# Patient Record
Sex: Female | Born: 1985
Health system: Southern US, Community
[De-identification: ages and names within clinical notes are randomized; demographics above are authoritative.]

## PROBLEM LIST (undated history)

## (undated) DIAGNOSIS — D649 Anemia, unspecified: Secondary | ICD-10-CM

## (undated) DIAGNOSIS — E669 Obesity, unspecified: Secondary | ICD-10-CM

## (undated) DIAGNOSIS — E039 Hypothyroidism, unspecified: Secondary | ICD-10-CM

## (undated) HISTORY — DX: Hypothyroidism, unspecified: E03.9

## (undated) HISTORY — DX: Obesity, unspecified: E66.9

## (undated) HISTORY — DX: Anemia, unspecified: D64.9

---

## 2012-05-14 ENCOUNTER — Ambulatory Visit (INDEPENDENT_AMBULATORY_CARE_PROVIDER_SITE_OTHER): Payer: BC Managed Care – PPO | Admitting: Family Medicine

## 2012-05-14 ENCOUNTER — Ambulatory Visit: Payer: BC Managed Care – PPO

## 2012-05-14 VITALS — BP 114/68 | HR 65 | Temp 98.6°F | Resp 16 | Ht 64.0 in | Wt 174.6 lb

## 2012-05-14 DIAGNOSIS — N898 Other specified noninflammatory disorders of vagina: Secondary | ICD-10-CM

## 2012-05-14 DIAGNOSIS — M79673 Pain in unspecified foot: Secondary | ICD-10-CM

## 2012-05-14 DIAGNOSIS — M79609 Pain in unspecified limb: Secondary | ICD-10-CM

## 2012-05-14 DIAGNOSIS — N760 Acute vaginitis: Secondary | ICD-10-CM

## 2012-05-14 DIAGNOSIS — S99929A Unspecified injury of unspecified foot, initial encounter: Secondary | ICD-10-CM

## 2012-05-14 DIAGNOSIS — S8990XA Unspecified injury of unspecified lower leg, initial encounter: Secondary | ICD-10-CM

## 2012-05-14 DIAGNOSIS — L293 Anogenital pruritus, unspecified: Secondary | ICD-10-CM

## 2012-05-14 DIAGNOSIS — B9689 Other specified bacterial agents as the cause of diseases classified elsewhere: Secondary | ICD-10-CM

## 2012-05-14 LAB — POCT WET PREP WITH KOH
Clue Cells Wet Prep HPF POC: NEGATIVE
WBC Wet Prep HPF POC: NEGATIVE
Yeast Wet Prep HPF POC: NEGATIVE

## 2012-05-14 MED ORDER — MELOXICAM 15 MG PO TABS: 15.0000 mg | ORAL_TABLET | Freq: Every day | ORAL | Status: AC

## 2012-05-14 MED ORDER — METRONIDAZOLE 500 MG PO TABS: 500.0000 mg | ORAL_TABLET | Freq: Two times a day (BID) | ORAL | Status: DC

## 2012-05-14 NOTE — Patient Instructions (Addendum)
Wear the post-op shoe for the next 1-2 weeks.  Take Mobic (meloxicam) once daily for the next two weeks to reduce pain and inflammation.  Do not take any additional ibuprofen or aleve with this medication.  You may take Tylenol if needed for extra pain relief.  If you are not seeing improvement in the next 1-2 weeks, let us know and we can refer you to a foot specialist.     Begin taking the Flagyl (metronidazole).  Be sure to finish the full course.  DO NOT drink alcohol while taking this medication or for 48 hours after your last dose.  If you are worsening or not improving, please let us know.   Bacterial Vaginosis Bacterial vaginosis (BV) is a vaginal infection where the normal balance of bacteria in the vagina is disrupted. The normal balance is then replaced by an overgrowth of certain bacteria. There are several different kinds of bacteria that can cause BV. BV is the most common vaginal infection in women of childbearing age. CAUSES   The cause of BV is not fully understood. BV develops when there is an increase or imbalance of harmful bacteria.  Some activities or behaviors can upset the normal balance of bacteria in the vagina and put women at increased risk including:  Having a new sex partner or multiple sex partners.  Douching.  Using an intrauterine device (IUD) for contraception.  It is not clear what role sexual activity plays in the development of BV. However, women that have never had sexual intercourse are rarely infected with BV. Women do not get BV from toilet seats, bedding, swimming pools or from touching objects around them.  SYMPTOMS   Grey vaginal discharge.  A fish-like odor with discharge, especially after sexual intercourse.  Itching or burning of the vagina and vulva.  Burning or pain with urination.  Some women have no signs or symptoms at all. DIAGNOSIS  Your caregiver must examine the vagina for signs of BV. Your caregiver will perform lab tests and  look at the sample of vaginal fluid through a microscope. They will look for bacteria and abnormal cells (clue cells), a pH test higher than 4.5, and a positive amine test all associated with BV.  RISKS AND COMPLICATIONS   Pelvic inflammatory disease (PID).  Infections following gynecology surgery.  Developing HIV.  Developing herpes virus. TREATMENT  Sometimes BV will clear up without treatment. However, all women with symptoms of BV should be treated to avoid complications, especially if gynecology surgery is planned. Female partners generally do not need to be treated. However, BV may spread between female sex partners so treatment is helpful in preventing a recurrence of BV.   BV may be treated with antibiotics. The antibiotics come in either pill or vaginal cream forms. Either can be used with nonpregnant or pregnant women, but the recommended dosages differ. These antibiotics are not harmful to the baby.  BV can recur after treatment. If this happens, a second round of antibiotics will often be prescribed.  Treatment is important for pregnant women. If not treated, BV can cause a premature delivery, especially for a pregnant woman who had a premature birth in the past. All pregnant women who have symptoms of BV should be checked and treated.  For chronic reoccurrence of BV, treatment with a type of prescribed gel vaginally twice a week is helpful. HOME CARE INSTRUCTIONS   Finish all medication as directed by your caregiver.  Do not have sex until treatment is completed.  Tell your sexual partner that you have a vaginal infection. They should see their caregiver and be treated if they have problems, such as a mild rash or itching.  Practice safe sex. Use condoms. Only have 1 sex partner. PREVENTION  Basic prevention steps can help reduce the risk of upsetting the natural balance of bacteria in the vagina and developing BV:  Do not have sexual intercourse (be abstinent).  Do not  douche.  Use all of the medicine prescribed for treatment of BV, even if the signs and symptoms go away.  Tell your sex partner if you have BV. That way, they can be treated, if needed, to prevent reoccurrence. SEEK MEDICAL CARE IF:   Your symptoms are not improving after 3 days of treatment.  You have increased discharge, pain, or fever. MAKE SURE YOU:   Understand these instructions.  Will watch your condition.  Will get help right away if you are not doing well or get worse. FOR MORE INFORMATION  Division of STD Prevention (DSTDP), Centers for Disease Control and Prevention: [employer] American Social Health Association (ASHA): www.ashastd.org  Document Released: 04/18/2005 Document Revised: 07/11/2011 Document Reviewed: 10/09/2008 [facility] Patient Information 2013 [place], [place].

## 2012-05-14 NOTE — Progress Notes (Signed)
Subjective:    Patient ID: Nicole Bush, female    DOB: 26-Apr-1986, 27 y.o.   MRN: [id number]  HPI   Nicole Bush is a 27 yr old female here with two concerns today.    (1) Right great toe pain - States she tripped and fell about 3-4 weeks ago; not sure what happened, but pain and swelling of the right great toe, no bruising.   Used ice, swelling went away.  Ibuprofen, tylenol for pain relief, but not helping.  Able to bear weight, but can only wear tennis shoes or boots. Has pain with walking but has been favoring the foot to avoid discomfort.  No numbness or change in sensation in the toe; other toes ok.  Does endorse pain into the forefoot with walking.    (2) Vaginal itching - Started maybe 2 days ago.  States she has had this before - has had BV and yeast in the past, but it has been awhile since her last episode.   Denies abnormal discharge.  No abnormal bleeding. No new exposures to detergents or hygiene products.  She is currently sexually active with 1 partner. States she has no concern for STI.  Normal pap Dec 2012, sees GYN for this. LMP about 2 weeks ago, normal.  Using OCPs.  Denies urinary symptoms, abd pain, nausea, vomiting, back pain.  Review of Systems  Constitutional: Negative for fever and chills.  HENT: Negative.   Respiratory: Negative.   Cardiovascular: Negative.   Gastrointestinal: Negative for nausea, vomiting and abdominal pain.  Genitourinary: Negative for dysuria, urgency, frequency, hematuria, flank pain, vaginal bleeding, vaginal discharge, genital sores, vaginal pain, menstrual problem and pelvic pain.       Vaginal itching   Musculoskeletal: Positive for arthralgias (R great toe).  Skin: Negative.   Neurological: Negative for weakness and numbness.       Objective:   Physical Exam  Vitals reviewed. Constitutional: She appears well-developed and well-nourished. No distress.  HENT:  Head: Normocephalic and atraumatic.  Eyes: Conjunctivae normal  are normal. No scleral icterus.  Cardiovascular: Normal rate, regular rhythm and normal heart sounds.  Exam reveals no gallop and no friction rub.   No murmur heard. Pulses:      Dorsalis pedis pulses are 2+ on the right side, and 2+ on the left side.       Posterior tibial pulses are 2+ on the right side, and 2+ on the left side.  Pulmonary/Chest: Effort normal and breath sounds normal. She has no wheezes. She has no rales.  Abdominal: Soft. Bowel sounds are normal. She exhibits no distension and no mass. There is no tenderness. There is no rigidity, no rebound, no guarding and no CVA tenderness.  Genitourinary: Uterus normal. There is no rash, tenderness or lesion on the right labia. There is no rash, tenderness or lesion on the left labia. Cervix exhibits no motion tenderness and no discharge. Right adnexum displays no mass, no tenderness and no fullness. Left adnexum displays no mass, no tenderness and no fullness. Vaginal discharge (white, homogenous) found.       Initial self wet prep unrevealing; on exam, white discharge at vaginal introitus and along vaginal walls; cervix normal, no cervical discharge; bimanual exam normal; additional wet prep sample taken  Musculoskeletal:       Right ankle: Normal.       Left ankle: Normal.       Right foot: She exhibits tenderness (first metatarsal). She exhibits normal range of motion,  no swelling, normal capillary refill, no crepitus and no deformity.       Left foot: Normal.       Feet:       TTP over first metatarsal; no swelling, ecchymosis, deformity; full AROM but painful      Filed Vitals:   05/14/12 1345  BP: 114/68  Pulse: 65  Temp: 98.6 F (37 C)  Resp: 16     [facility] reading (PRIMARY) by  Dr. Patsy Lager - normal foot xray, no fracture.    Results for orders placed in visit on 05/14/12  POCT WET PREP WITH KOH      Component Value Range   Trichomonas, UA Negative     Clue Cells Wet Prep HPF POC neg     Epithelial Wet Prep HPF  POC 4-8     Yeast Wet Prep HPF POC neg     Bacteria Wet Prep HPF POC 4+     RBC Wet Prep HPF POC neg     WBC Wet Prep HPF POC 0-1     KOH Prep POC Negative    POCT WET PREP WITH KOH      Component Value Range   Trichomonas, UA Negative     Clue Cells Wet Prep HPF POC 2-4     Epithelial Wet Prep HPF POC 10-18     Yeast Wet Prep HPF POC neg     Bacteria Wet Prep HPF POC 4+     RBC Wet Prep HPF POC 0-1     WBC Wet Prep HPF POC neg     KOH Prep POC Positive         Assessment & Plan:   1. Foot pain  DG Foot Complete Right, meloxicam (MOBIC) 15 MG tablet  2. Foot injury  DG Foot Complete Right  3. Bacterial vaginitis  metroNIDAZOLE (FLAGYL) 500 MG tablet  4. Vaginal itching  POCT Wet Prep with KOH, POCT Wet Prep with KOH    Nicole Bush is a very pleasant 27 yr old female here with right foot/toe pain and bacterial vaginosis  (1-2)  Foot pain/injury - s/p injury to the right great toe/foot 3-4 weeks ago with continued discomfort with walking.  Complete x-rays of the foot were negative for fracture.  Suspect sprain injury.  Discussed the use of a post-op shoe for the next 1-2 weeks to provide additional immobilization.  Will try Mobic for pain relief.  Pt states that she has recently enrolled in a boot camp twice/wk and would like to participate if possible.  Discussed that this may delay healing but that if she is able to participate without pain this should be ok.  Encouraged relative rest when possible.  If not seeing any improvement in the next 1-2 weeks, may considering referring to foot specialist.  (3-4) Bacterial vaginosis - Initial self wet prep collected by pt was unrevealing.  On exam, white discharge coating vaginal walls.  Repeat wet prep with clue cells and +KOH.  Will treat as BV with Flagyl x 7 days.  Discussed avoidance of etoh while on flagyl.  Discussed RTC precautions, pt understands and is in agreement with this plan.

## 2012-05-24 ENCOUNTER — Telehealth: Payer: Self-pay

## 2012-05-24 NOTE — Telephone Encounter (Signed)
Patient is not any better from her OV with [facility].  [phone]  Cell phone  [phone]  Work phone

## 2012-05-24 NOTE — Telephone Encounter (Signed)
Spoke with pt, I advised her to recheck because her SX's are not improving. Her toe is still in a lot of pain. She will RTC but cannot come into clinic until Wednesday due to her schedule. She also wanted to see if [facility] can write her a note stating her toe injury is preventing her from participating in her boot camp lessons that she paid for. They will not give her a refund without a medical note. Please advise.

## 2012-05-24 NOTE — Telephone Encounter (Signed)
Called pt to get details, LMOM to CB.

## 2012-05-25 NOTE — Telephone Encounter (Signed)
Yes pt should RTC.  I would advise her to continue the post-op wearing and resting when possible.  We can give her a note for bootcamp when she comes back in

## 2012-05-25 NOTE — Telephone Encounter (Signed)
Called pt, gave msg  from [facility], pt understood.

## 2013-01-10 ENCOUNTER — Encounter: Payer: Self-pay | Admitting: Family Medicine

## 2013-01-10 ENCOUNTER — Ambulatory Visit (INDEPENDENT_AMBULATORY_CARE_PROVIDER_SITE_OTHER): Payer: BC Managed Care – PPO | Admitting: Family Medicine

## 2013-01-10 VITALS — BP 102/68 | HR 89 | Temp 99.0°F | Resp 16 | Ht 64.0 in | Wt 180.0 lb

## 2013-01-10 DIAGNOSIS — H6692 Otitis media, unspecified, left ear: Secondary | ICD-10-CM

## 2013-01-10 DIAGNOSIS — H6690 Otitis media, unspecified, unspecified ear: Secondary | ICD-10-CM

## 2013-01-10 MED ORDER — CEFDINIR 300 MG PO CAPS: 300.0000 mg | ORAL_CAPSULE | Freq: Two times a day (BID) | ORAL | Status: DC

## 2013-01-10 NOTE — Progress Notes (Signed)
Patient ID: Nicole Bush MRN: [id number], DOB: Nov 17, 1985, 27 y.o. Date of Encounter: 01/10/2013, 5:09 PM  Primary Physician: No primary provider on file.  Chief Complaint:  Chief Complaint  Patient presents with  . Headache    x 1 day  . Otalgia    x 1 day  . Dizziness    x 1 days    HPI: 27 y.o. year old female presents with 1 day history of otalgia. Symptoms began with nasal congestion, sore throat, and cough. Afebrile. Nasal congestion thick and green/yellow. Cough is  Sounds productive. Ear painful and  feels full, leading to sensation of muffled hearing. No drainage or discharge from affected ear. Has tried OTC cold preps without success. No GI complaints.   No sick contacts, recent antibiotics, or recent travels.   Here with   History reviewed. No pertinent past medical history.   Home Meds: Prior to Admission medications   Medication Sig Start Date End Date Taking? Authorizing Provider  norgestimate-ethinyl estradiol (ORTHO-CYCLEN,SPRINTEC,PREVIFEM) 0.25-35 MG-MCG tablet Take 1 tablet by mouth daily.   Yes Historical Provider, MD  traZODone (DESYREL) 50 MG tablet Take 50 mg by mouth at bedtime.   Yes Historical Provider, MD  cefdinir (OMNICEF) 300 MG capsule Take 1 capsule (300 mg total) by mouth 2 (two) times daily. 01/10/13   Elvina Sidle, MD  levonorgestrel-ethinyl estradiol (AVIANE,ALESSE,LESSINA) 0.1-20 MG-MCG tablet Take 1 tablet by mouth daily.    Historical Provider, MD  meloxicam (MOBIC) 15 MG tablet Take 1 tablet (15 mg total) by mouth daily. 05/14/12   Eleanore Delia Chimes, PA-C  metroNIDAZOLE (FLAGYL) 500 MG tablet Take 1 tablet (500 mg total) by mouth 2 (two) times daily with a meal. DO NOT CONSUME ALCOHOL WHILE TAKING THIS MEDICATION. 05/14/12   Eleanore Delia Chimes, PA-C    Allergies: No Known Allergies  History   Social History  . Marital Status: Single    Spouse Name: N/A    Number of Children: N/A  . Years of Education: N/A   Occupational  History  . Not on file.   Social History Main Topics  . Smoking status: Never Smoker   . Smokeless tobacco: Not on file  . Alcohol Use: No  . Drug Use: No  . Sexual Activity: Yes   Other Topics Concern  . Not on file   Social History Narrative  . No narrative on file     Review of Systems: Constitutional: negative for chills, fever, night sweats or weight changes HEENT: see above Respiratory: negative for hemoptysis, wheezing, or shortness of breath Abdominal: negative for abdominal pain, nausea, vomiting or diarrhea Dermatological: negative for rash Neurologic: negative for headache   Physical Exam: Blood pressure 102/68, pulse 89, temperature 99 F (37.2 C), temperature source Oral, resp. rate 16, height 5\' 4"  (1.626 m), weight 180 lb (81.647 kg), last menstrual period 12/27/2012, SpO2 99.00%., Body mass index is 30.88 kg/(m^2). General: Well developed, well nourished, in no acute distress. Head: Normocephalic, atraumatic, eyes without discharge, sclera non-icteric, nares are congested. Bilateral auditory canals clear. left TM dull, and bulging with purulent effusion behind. No perforation visualized. Contralateral TM pearly grey with reflective cone of light, no effusion or perforation visualized. Oral cavity moist, dentition normal. Posterior pharynx with post nasal drip and mild erythema. No peritonsillar abscess or tonsillar exudate. Neck: Supple. No thyromegaly. Full ROM. No lymphadenopathy. Lungs: Clear bilaterally to auscultation without wheezes, rales, or rhonchi. Breathing is unlabored.  Heart: RRR with S1 S2. No murmurs,  rubs, or gallops appreciated. Abdomen: Soft, non-tender, non-distended with normoactive bowel sounds. No hepatosplenomegaly. No rebound/guarding. No obvious abdominal masses. McBurney's, Rovsing's, Iliopsoas, and table jar all negative. Msk:  Strength and tone normal for age. Extremities: No clubbing or cyanosis. No edema. Neuro: Alert and oriented X  3. Moves all extremities spontaneously. CNII-XII grossly in tact. Psych:  Responds to questions appropriately with a normal affect.     ASSESSMENT AND PLAN:  27 y.o. year old female with acute otitis media of left ear without perforation. Otitis media, left - Plan: cefdinir (OMNICEF) 300 MG capsule   - -Mucinex for children -Tylenol prn -Rest/fluids -RTC precautions -RTC 3 days if no improvement  Alonna Buckler, md 01/10/2013 5:09 PM

## 2013-01-10 NOTE — Patient Instructions (Addendum)
Otitis Media with Effusion Otitis media with effusion is the presence of fluid in the middle ear. This is a common problem that often follows ear infections. It may be present for weeks or longer after the infection. Unlike an acute ear infection, otits media with effusion refers only to fluid behind the ear drum and not infection. Children with repeated ear and sinus infections and allergy problems are the most likely to get otitis media with effusion. CAUSES  The most frequent cause of the fluid buildup is dysfunction of the eustacian tubes. These are the tubes that drain fluid in the ears to the throat. SYMPTOMS   The main symptom of this condition is hearing loss. As a result, you or your child may:  Listen to the TV at a loud volume.  Not respond to questions.  Ask "what" often when spoken to.  There may be a sensation of fullness or pressure but usually not pain. DIAGNOSIS   Your caregiver will diagnose this condition by examining you or your child's ears.  Your caregiver may test the pressure in you or your child's ear with a tympanometer.  A hearing test may be conducted if the problem persists.  A caregiver will want to re-evaluate the condition periodically to see if it improves. TREATMENT   Treatment depends on the duration and the effects of the effusion.  Antibiotics, decongestants, nose drops, and cortisone-type drugs may not be helpful.  Children with persistent ear effusions may have delayed language. Children at risk for developmental delays in hearing, learning, and speech may require referral to a specialist earlier than children not at risk.  You or your child's caregiver may suggest a referral to an Ear, Nose, and Throat (ENT) surgeon for treatment. The following may help restore normal hearing:  Drainage of fluid.  Placement of ear tubes (tympanostomy tubes).  Removal of adenoids (adenoidectomy). HOME CARE INSTRUCTIONS   Avoid second hand  smoke.  Infants who are breast fed are less likely to have this condition.  Avoid feeding infants while laying flat.  Avoid known environmental allergens.  Be sure to see a caregiver or an ENT specialist for follow up.  Avoid people who are sick. SEEK MEDICAL CARE IF:   Hearing is not better in 3 months.  Hearing is worse.  Ear pain.  Drainage from the ear.  Dizziness. Document Released: 05/26/2004 Document Revised: 07/11/2011 Document Reviewed: 09/08/2009 [employer] Patient Information 2014 [employer].  

## 2013-01-13 ENCOUNTER — Telehealth: Payer: Self-pay

## 2013-01-13 NOTE — Telephone Encounter (Signed)
Patient says the antibiotic is not working and she doesn't feel any better. Is wondering if there is something else she could take instead.

## 2013-01-14 NOTE — Telephone Encounter (Signed)
-  Mucinex for children  -Tylenol prn  -Rest/fluids  -RTC precautions  -RTC 3 days if no improvement

## 2013-01-14 NOTE — Telephone Encounter (Signed)
Called , is she using mucinex? She can add flonase OTC to this. If not helpful she should return to clinic. Called her, left message for her to call me back.

## 2013-01-14 NOTE — Telephone Encounter (Signed)
Patient advised to use mucinex, she has not been using and to add flonase, if these are not helpful, or if she gets worse, she should come back in.

## 2013-05-29 ENCOUNTER — Ambulatory Visit (INDEPENDENT_AMBULATORY_CARE_PROVIDER_SITE_OTHER): Payer: BC Managed Care – PPO | Admitting: Physician Assistant

## 2013-05-29 VITALS — BP 110/70 | HR 77 | Temp 98.5°F | Resp 16 | Ht 65.0 in | Wt 190.0 lb

## 2013-05-29 DIAGNOSIS — Z113 Encounter for screening for infections with a predominantly sexual mode of transmission: Secondary | ICD-10-CM

## 2013-05-29 DIAGNOSIS — B3731 Acute candidiasis of vulva and vagina: Secondary | ICD-10-CM

## 2013-05-29 DIAGNOSIS — B373 Candidiasis of vulva and vagina: Secondary | ICD-10-CM

## 2013-05-29 DIAGNOSIS — L293 Anogenital pruritus, unspecified: Secondary | ICD-10-CM

## 2013-05-29 DIAGNOSIS — N898 Other specified noninflammatory disorders of vagina: Secondary | ICD-10-CM

## 2013-05-29 LAB — POCT URINALYSIS DIPSTICK
Bilirubin, UA: NEGATIVE
Blood, UA: NEGATIVE
Glucose, UA: NEGATIVE
Ketones, UA: NEGATIVE
Leukocytes, UA: NEGATIVE
Nitrite, UA: NEGATIVE
Protein, UA: NEGATIVE
Spec Grav, UA: >=1.03
Urobilinogen, UA: 0.2
pH, UA: 6

## 2013-05-29 LAB — POCT WET PREP WITH KOH
KOH Prep POC: POSITIVE
Trichomonas, UA: NEGATIVE
Yeast Wet Prep HPF POC: POSITIVE

## 2013-05-29 LAB — POCT UA - MICROSCOPIC ONLY
Casts, Ur, LPF, POC: NEGATIVE
Crystals, Ur, HPF, POC: NEGATIVE
Yeast, UA: NEGATIVE

## 2013-05-29 MED ORDER — FLUCONAZOLE 150 MG PO TABS: 150.0000 mg | ORAL_TABLET | Freq: Once | ORAL | Status: AC

## 2013-05-29 NOTE — Patient Instructions (Signed)
Gyne-Lotrimin OTC

## 2013-05-29 NOTE — Progress Notes (Signed)
Subjective:    Patient ID: Nicole Bush, female    DOB: [birth year], 28 y.o.   MRN: [id number]  HPI  28 year old female presents for evaluation of vaginal pruritis x 3 days.  Admits that she has pruritis on the skin surrounding her vagina and inside the vaginal vault as well. Has not noticed any discharge, odor, or pain.  Hx of yeast and BV in the past. Denies nausea, vomiting, abdominal pain, dyspareunia, dysuria, or urinary frequency.  Takes OCP's and uses condoms regularly. Is sexually active with 1 female partner. No specific concern about STI's but would like to be checked for GC/CL.  Has her regular gyn appt in 1 month. Last pap 1 year ago - normal. Had HIV/RPR testing then which was negative. Does not want HIV/RPR today.   Patient is otherwise healthy with no other concerns today.     Review of Systems  Gastrointestinal: Negative for nausea and vomiting.  Genitourinary: Negative for dysuria, frequency, flank pain, vaginal discharge, genital sores, vaginal pain and pelvic pain.       Objective:   Physical Exam  Constitutional: She is oriented to person, place, and time. She appears well-developed and well-nourished.  HENT:  Head: Normocephalic and atraumatic.  Right Ear: External ear normal.  Left Ear: External ear normal.  Eyes: Conjunctivae are normal.  Neck: Normal range of motion.  Cardiovascular: Normal rate.   Pulmonary/Chest: Effort normal.  Genitourinary: Vagina normal. Pelvic exam was performed with patient supine. There is no rash, tenderness or lesion on the right labia. There is no rash, tenderness or lesion on the left labia. Cervix exhibits discharge (thick, clumpy, white vaginal discharge). Cervix exhibits no motion tenderness and no friability. Right adnexum displays no mass, no tenderness and no fullness. Left adnexum displays no mass, no tenderness and no fullness.  Neurological: She is alert and oriented to person, place, and time.  Psychiatric: She has a  normal mood and affect. Her behavior is normal. Judgment and thought content normal.      Results for orders placed in visit on 05/29/13  POCT UA - MICROSCOPIC ONLY      Result Value Range   WBC, Ur, HPF, POC 0-2     RBC, urine, microscopic 0-2     Bacteria, U Microscopic trace     Mucus, UA trace     Epithelial cells, urine per micros 1-3     Crystals, Ur, HPF, POC neg     Casts, Ur, LPF, POC neg     Yeast, UA neg    POCT URINALYSIS DIPSTICK      Result Value Range   Color, UA yellow     Clarity, UA clear     Glucose, UA neg     Bilirubin, UA neg     Ketones, UA neg     Spec Grav, UA >=1.030     Blood, UA neg     pH, UA 6.0     Protein, UA neg     Urobilinogen, UA 0.2     Nitrite, UA neg     Leukocytes, UA Negative    POCT WET PREP WITH KOH      Result Value Range   Trichomonas, UA Negative     Clue Cells Wet Prep HPF POC 0-1     Epithelial Wet Prep HPF POC 6-12     Yeast Wet Prep HPF POC positive     Bacteria Wet Prep HPF POC 1+  RBC Wet Prep HPF POC 0-2     WBC Wet Prep HPF POC 8-12     KOH Prep POC Positive         Assessment & Plan:  Vaginal itching - Plan: POCT UA - Microscopic Only, POCT urinalysis dipstick  Leukorrhea, not specified as infective - Plan: POCT Wet Prep with KOH  Screening for venereal disease - Plan: GC/Chlamydia Probe Amp  Candidiasis, vagina - Plan: fluconazole (DIFLUCAN) 150 MG tablet  Will treat for yeast infection with diflucan 150 mg today. May repeat in 7 days if symptoms persist GC/CL pending Follow up with GYN as planned in 1 month. Return here if symptoms worsen or fail to improve.

## 2013-05-31 LAB — GC/CHLAMYDIA PROBE AMP
CT Probe RNA: NEGATIVE
GC Probe RNA: NEGATIVE

## 2014-05-05 IMAGING — CR DG FOOT COMPLETE 3+V*R*
3 series · 3 of 3 positions shown · non-contrast
Comparison: None.

CLINICAL DATA: Foot injury.  Right foot pain.

RIGHT FOOT COMPLETE - 3+ VIEW

[AP]
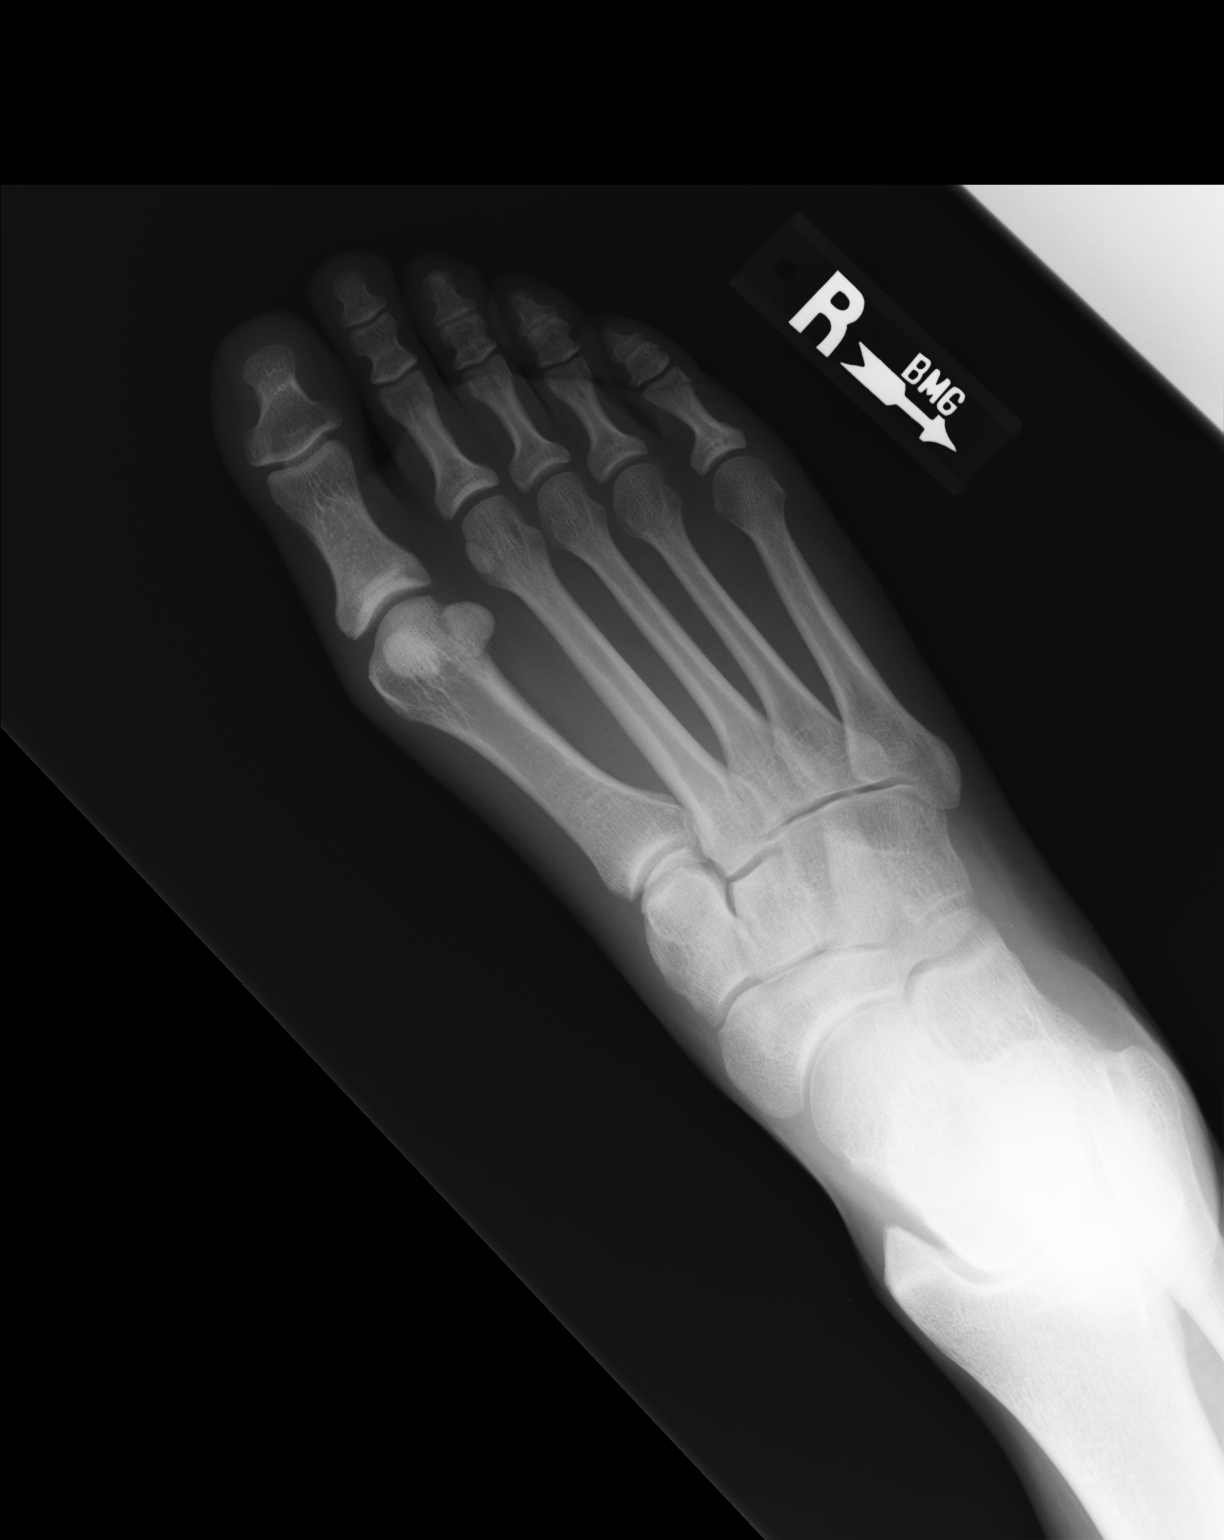

[ap obl int rot]
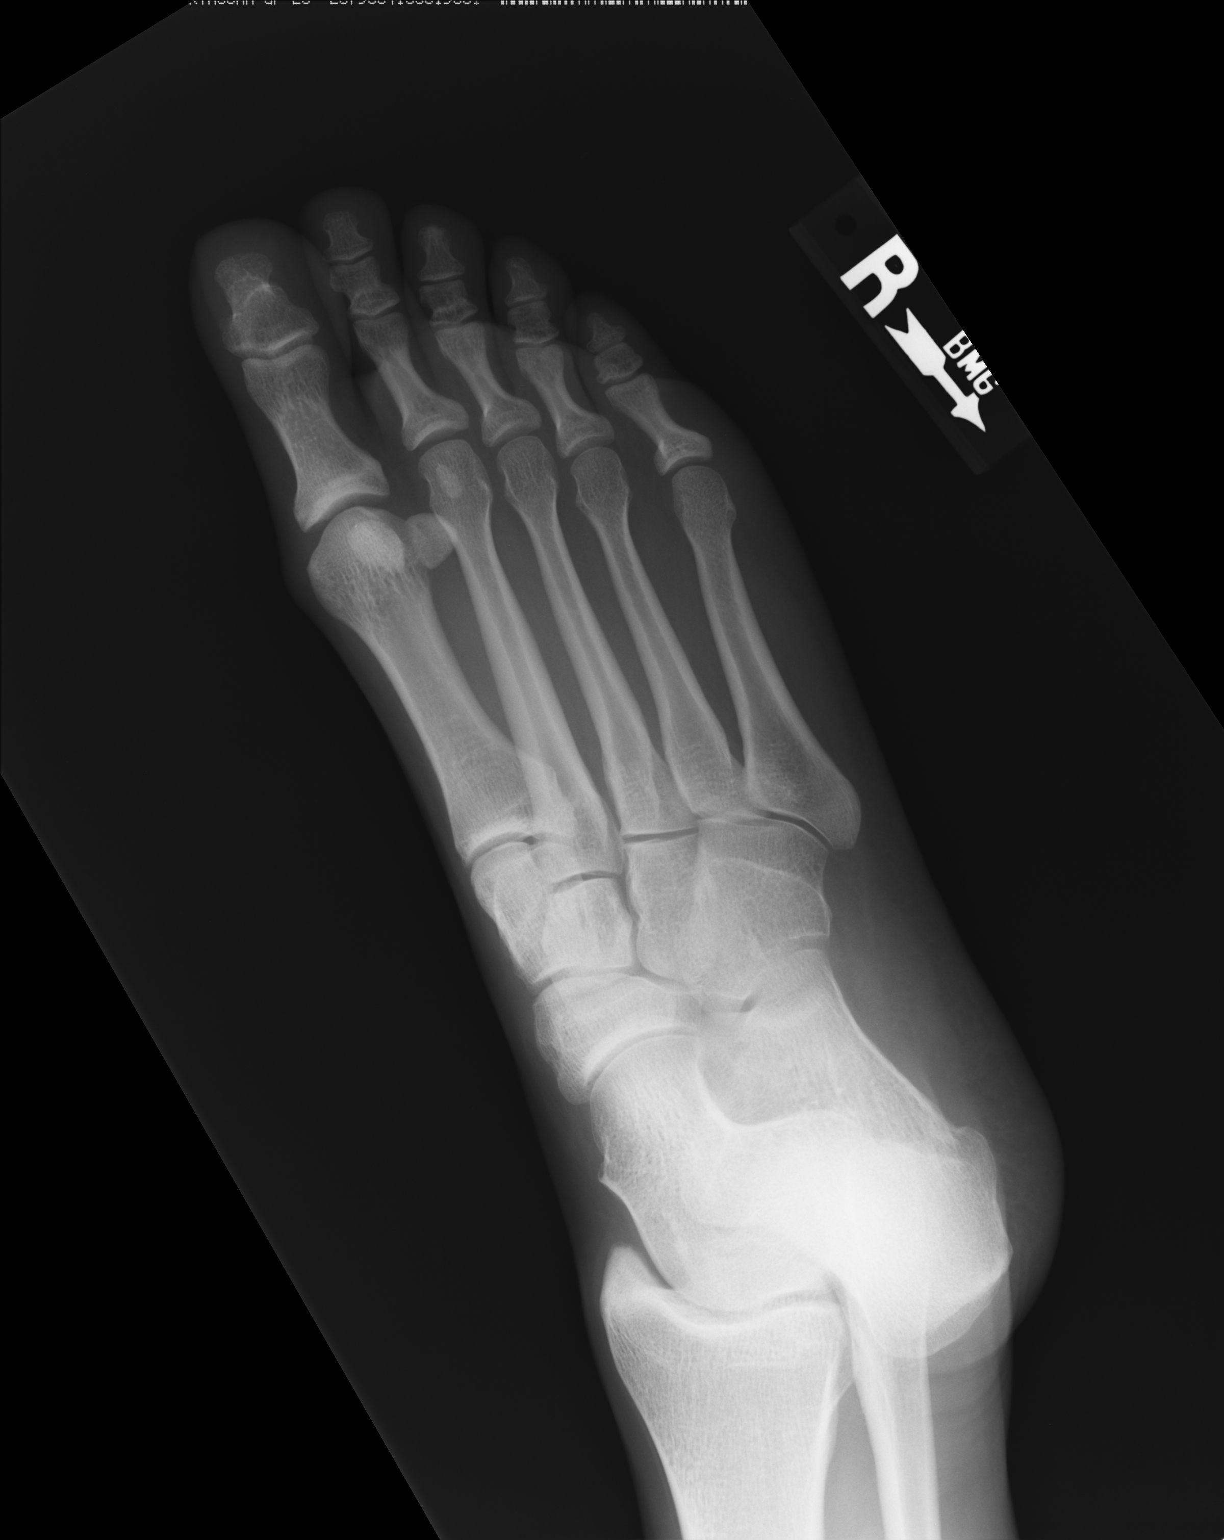

[lateral]
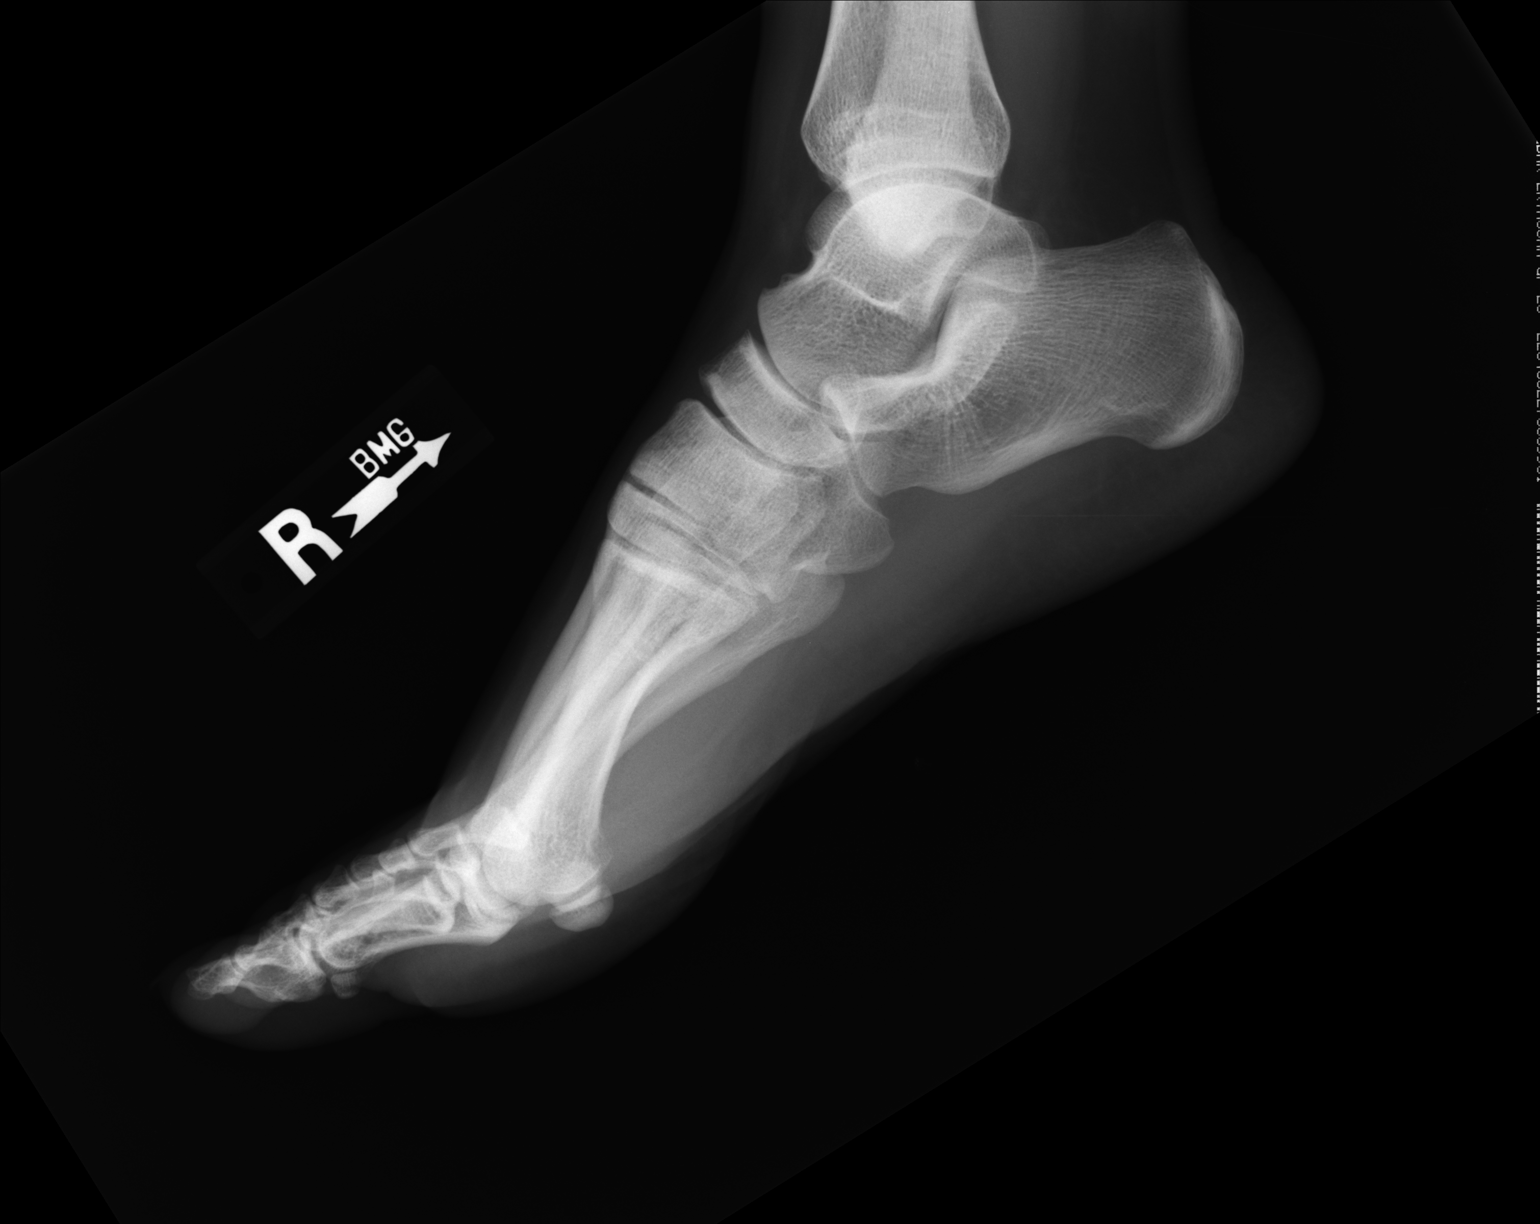

[3 of 3 positions shown; findings below may reference images not displayed]

FINDINGS: The anatomic alignment of the right foot.  No fracture.
Soft tissues appear within normal limits.  There are no erosions.
IMPRESSION: Normal right foot radiographs.

## 2017-06-02 ENCOUNTER — Encounter: Payer: Self-pay | Admitting: Endocrinology

## 2017-12-22 ENCOUNTER — Encounter: Payer: Self-pay | Admitting: Gastroenterology

## 2018-02-07 ENCOUNTER — Ambulatory Visit: Payer: Self-pay | Admitting: Gastroenterology

## 2019-03-12 ENCOUNTER — Other Ambulatory Visit: Payer: Self-pay

## 2019-03-12 DIAGNOSIS — Z20822 Contact with and (suspected) exposure to covid-19: Secondary | ICD-10-CM

## 2019-03-14 LAB — NOVEL CORONAVIRUS, NAA: SARS-CoV-2, NAA: NOT DETECTED

## 2020-10-06 ENCOUNTER — Encounter (INDEPENDENT_AMBULATORY_CARE_PROVIDER_SITE_OTHER): Payer: Self-pay

## 2021-12-08 ENCOUNTER — Encounter (INDEPENDENT_AMBULATORY_CARE_PROVIDER_SITE_OTHER): Payer: Self-pay
